# Patient Record
Sex: Female | Born: 1945 | Race: White | Hispanic: No | Marital: Married
Health system: Southern US, Community
[De-identification: ages and names within clinical notes are randomized; demographics above are authoritative.]

## PROBLEM LIST (undated history)

## (undated) DIAGNOSIS — F419 Anxiety disorder, unspecified: Secondary | ICD-10-CM

## (undated) DIAGNOSIS — Z8249 Family history of ischemic heart disease and other diseases of the circulatory system: Secondary | ICD-10-CM

## (undated) DIAGNOSIS — N6009 Solitary cyst of unspecified breast: Secondary | ICD-10-CM

## (undated) DIAGNOSIS — J309 Allergic rhinitis, unspecified: Secondary | ICD-10-CM

## (undated) DIAGNOSIS — R0602 Shortness of breath: Secondary | ICD-10-CM

## (undated) DIAGNOSIS — R200 Anesthesia of skin: Secondary | ICD-10-CM

## (undated) DIAGNOSIS — K269 Duodenal ulcer, unspecified as acute or chronic, without hemorrhage or perforation: Secondary | ICD-10-CM

## (undated) DIAGNOSIS — M7072 Other bursitis of hip, left hip: Secondary | ICD-10-CM

## (undated) DIAGNOSIS — R079 Chest pain, unspecified: Secondary | ICD-10-CM

## (undated) DIAGNOSIS — R5383 Other fatigue: Secondary | ICD-10-CM

## (undated) DIAGNOSIS — R1013 Epigastric pain: Secondary | ICD-10-CM

## (undated) HISTORY — DX: Duodenal ulcer, unspecified as acute or chronic, without hemorrhage or perforation: K26.9

## (undated) HISTORY — DX: Solitary cyst of unspecified breast: N60.09

## (undated) HISTORY — DX: Chest pain, unspecified: R07.9

## (undated) HISTORY — DX: Family history of ischemic heart disease and other diseases of the circulatory system: Z82.49

## (undated) HISTORY — DX: Shortness of breath: R06.02

## (undated) HISTORY — DX: Other bursitis of hip, left hip: M70.72

## (undated) HISTORY — DX: Anxiety disorder, unspecified: F41.9

## (undated) HISTORY — DX: Other fatigue: R53.83

## (undated) HISTORY — DX: Anesthesia of skin: R20.0

## (undated) HISTORY — DX: Allergic rhinitis, unspecified: J30.9

## (undated) HISTORY — DX: Epigastric pain: R10.13

---

## 1997-09-22 ENCOUNTER — Other Ambulatory Visit: Admission: RE | Admit: 1997-09-22 | Discharge: 1997-09-22 | Payer: Self-pay | Admitting: Gynecology

## 1999-01-26 ENCOUNTER — Other Ambulatory Visit: Admission: RE | Admit: 1999-01-26 | Discharge: 1999-01-26 | Payer: Self-pay | Admitting: Gynecology

## 1999-01-26 ENCOUNTER — Encounter: Payer: Self-pay | Admitting: Gynecology

## 1999-01-26 ENCOUNTER — Encounter: Admission: RE | Admit: 1999-01-26 | Discharge: 1999-01-26 | Payer: Self-pay | Admitting: Gynecology

## 2000-06-10 ENCOUNTER — Other Ambulatory Visit: Admission: RE | Admit: 2000-06-10 | Discharge: 2000-06-10 | Payer: Self-pay | Admitting: Gynecology

## 2000-06-10 ENCOUNTER — Encounter: Payer: Self-pay | Admitting: Gynecology

## 2000-06-10 ENCOUNTER — Encounter: Admission: RE | Admit: 2000-06-10 | Discharge: 2000-06-10 | Payer: Self-pay | Admitting: Gynecology

## 2001-09-07 ENCOUNTER — Other Ambulatory Visit: Admission: RE | Admit: 2001-09-07 | Discharge: 2001-09-07 | Payer: Self-pay | Admitting: Gynecology

## 2001-09-07 ENCOUNTER — Encounter: Payer: Self-pay | Admitting: Gynecology

## 2001-09-07 ENCOUNTER — Encounter: Admission: RE | Admit: 2001-09-07 | Discharge: 2001-09-07 | Payer: Self-pay | Admitting: Gynecology

## 2003-02-02 ENCOUNTER — Ambulatory Visit (HOSPITAL_COMMUNITY): Admission: RE | Admit: 2003-02-02 | Discharge: 2003-02-02 | Payer: Self-pay | Admitting: Gastroenterology

## 2003-02-02 ENCOUNTER — Encounter (INDEPENDENT_AMBULATORY_CARE_PROVIDER_SITE_OTHER): Payer: Self-pay | Admitting: *Deleted

## 2003-03-07 ENCOUNTER — Encounter: Admission: RE | Admit: 2003-03-07 | Discharge: 2003-03-07 | Payer: Self-pay | Admitting: Gastroenterology

## 2004-01-26 ENCOUNTER — Ambulatory Visit (HOSPITAL_COMMUNITY): Admission: RE | Admit: 2004-01-26 | Discharge: 2004-01-26 | Payer: Self-pay | Admitting: Gynecology

## 2005-07-08 IMAGING — CT CT PELVIS W/ CM
1 series · 16 of 32 positions shown, 20 images · IV contrast (GASTRO. & OMNIPAQUE [ID])
Comparison: none

CLINICAL DATA: Left upper quadrant pain.
TECHNIQUE: Multidetector helical CT imaging performed through the abdomen and pelvis following dilute oral contrast and 033cc of Omnipaque 300 IV. 
 CT ABDOMEN W/CONTRAST: 
 Small low density area is noted in the tip of the liver most likely a small cyst.  Spleen, pancreas, adrenals, and kidneys are unremarkable.  Bowel and gallbladder grossly unremarkable.  No free fluid, free air, or adenopathy.  Lung bases are clear.

[Series 2: — · axial · 0.70mm/px · z∈[-417,-17]mm · 16 of 118 slices shown, 20 images]
[im 8/118  soft-tissue]
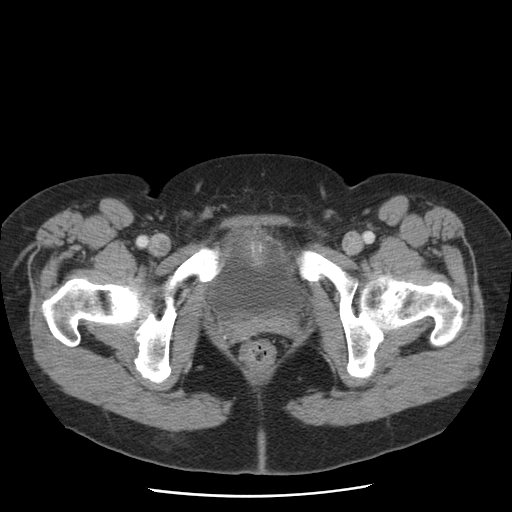
[im 8/118  bone]
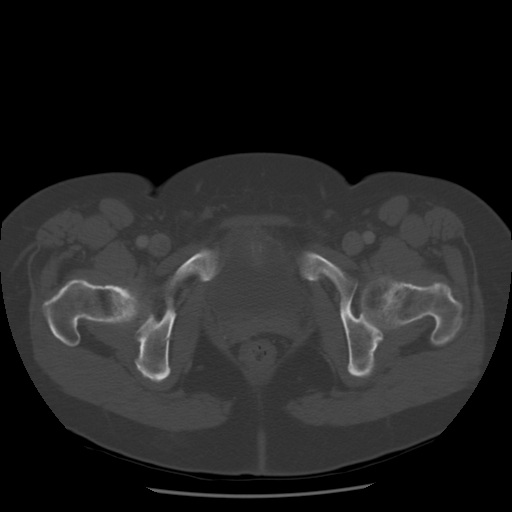
[im 16/118  soft-tissue]
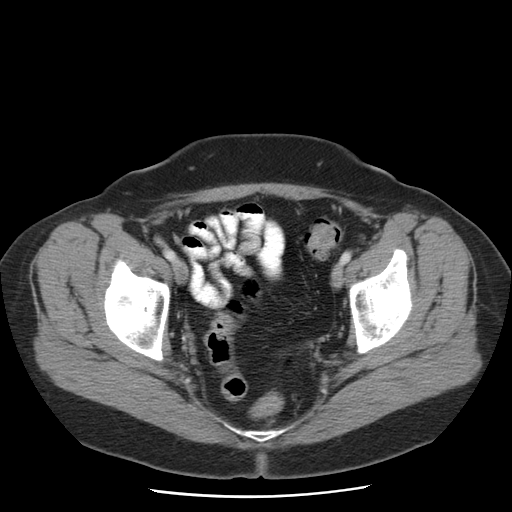
[im 23/118  soft-tissue]
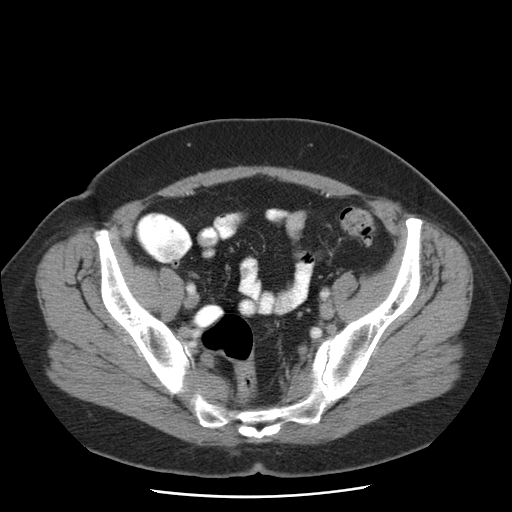
[im 31/118  soft-tissue]
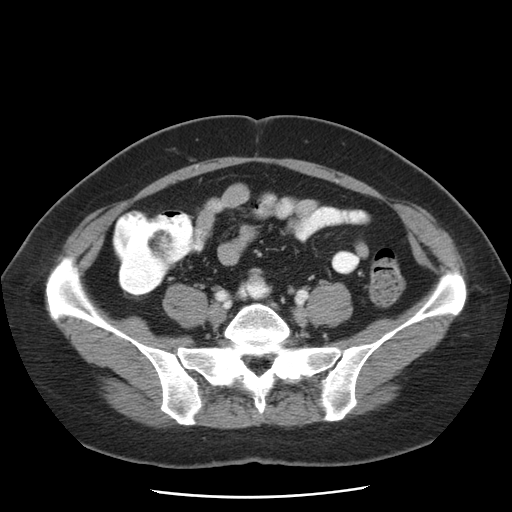
[im 38/118  soft-tissue]
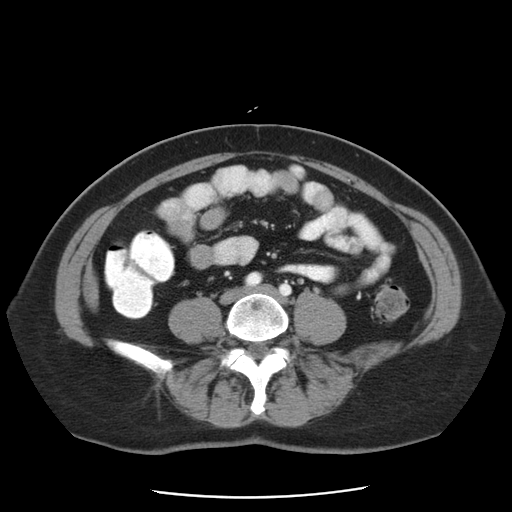
[im 46/118  soft-tissue]
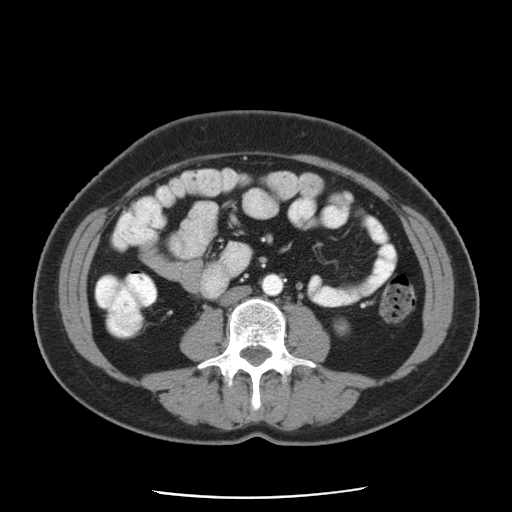
[im 53/118  soft-tissue]
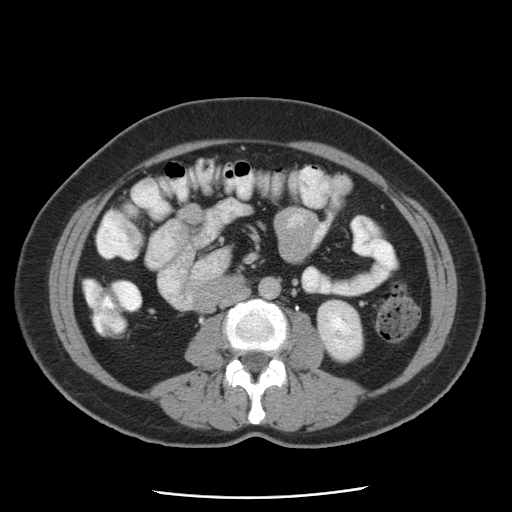
[im 65/118  soft-tissue]
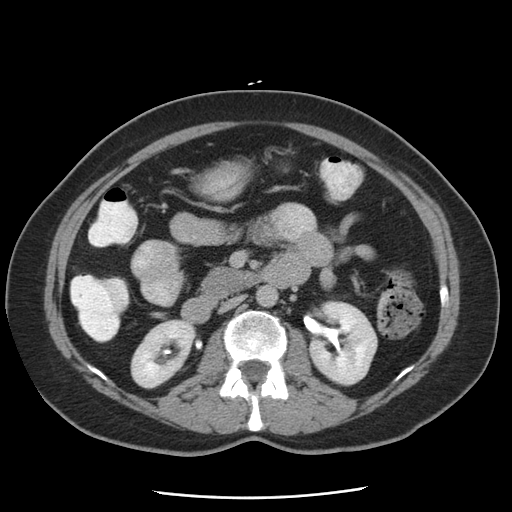
[im 72/118  soft-tissue]
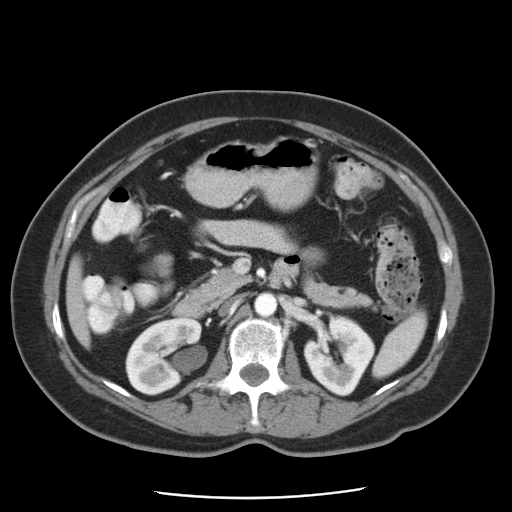
[im 72/118  bone]
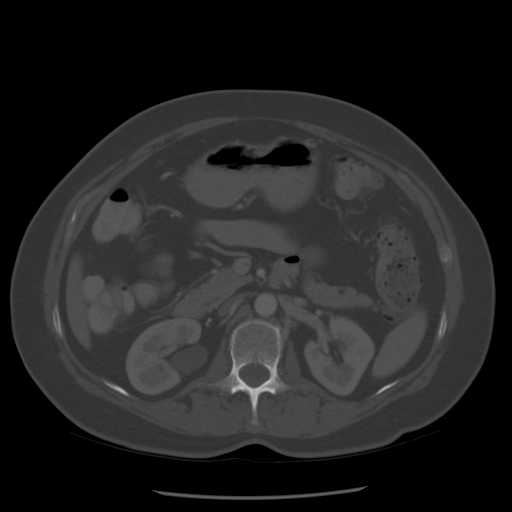
[im 80/118  soft-tissue]
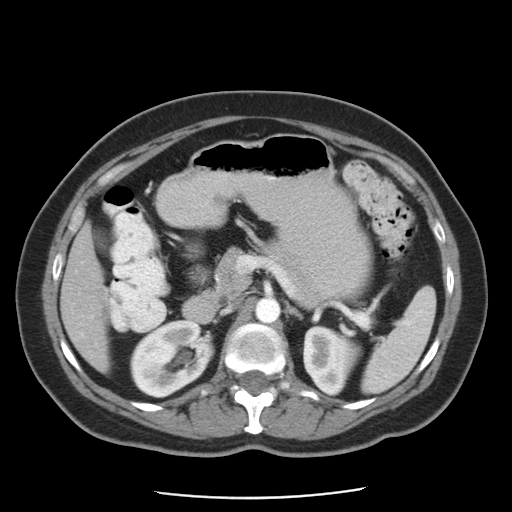
[im 87/118  soft-tissue]
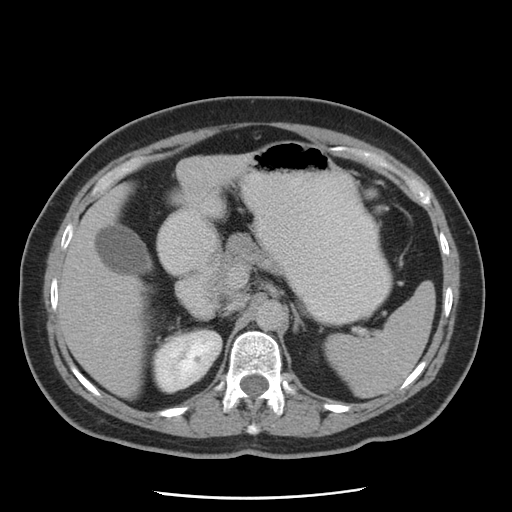
[im 95/118  soft-tissue]
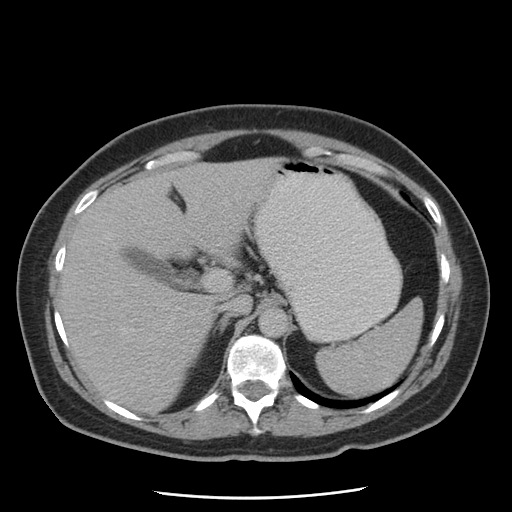
[im 102/118  soft-tissue]
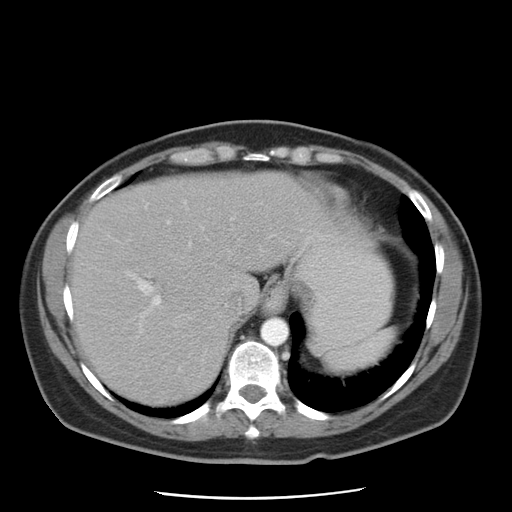
[im 102/118  lung]
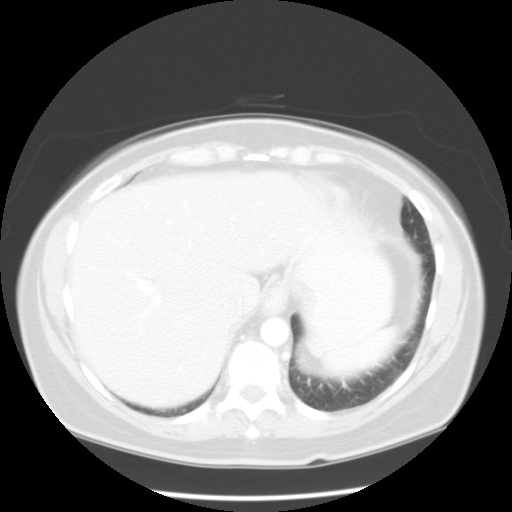
[im 106/118  lung]
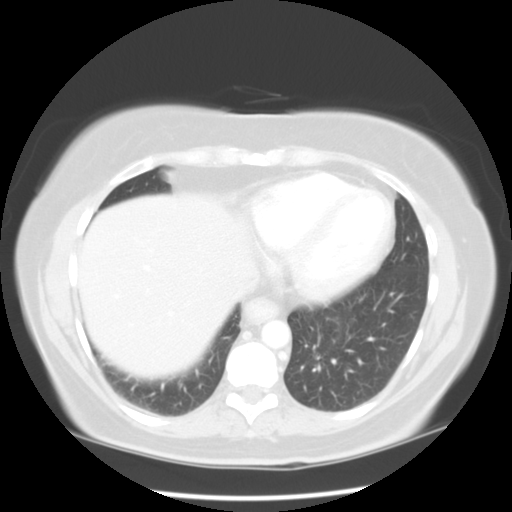
[im 110/118  soft-tissue]
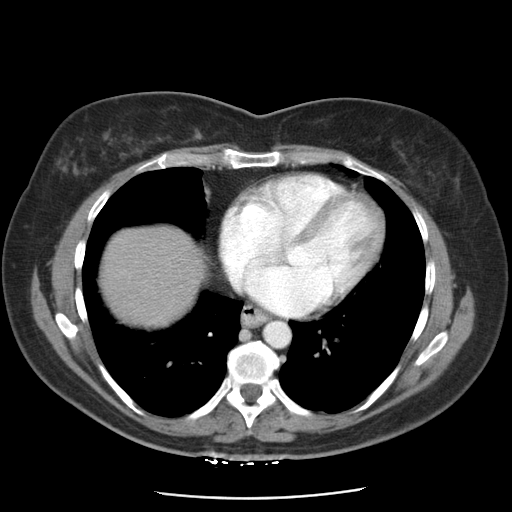
[im 110/118  lung]
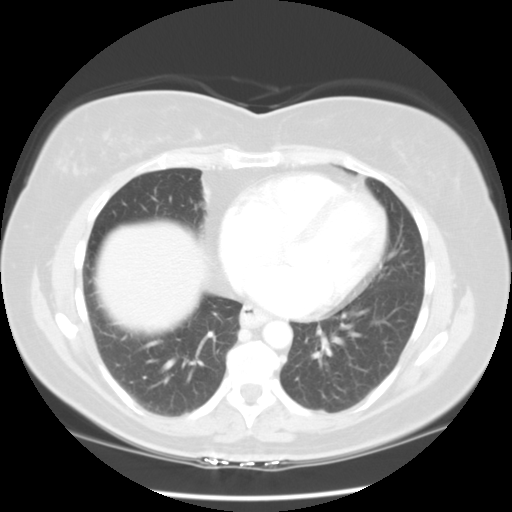
[im 114/118  lung]
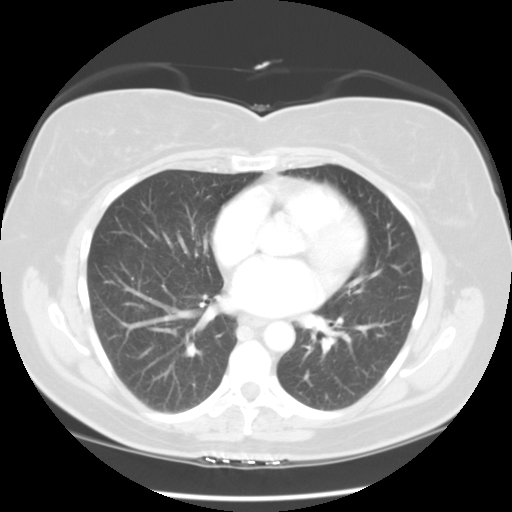

[16 of 32 positions shown; findings below may reference images not displayed]

IMPRESSION: No acute disease in the abdomen. 
 CT PELVIS W/CONTRAST: 
 Scattered diverticula are noted within the distal colon.  No evidence of active diverticulitis.  Appendix is normal.  Bladder is unremarkable.  No free fluid, free air, or adenopathy.
IMPRESSION: Diverticulosis.  No acute disease in the pelvis.

## 2006-07-22 ENCOUNTER — Ambulatory Visit (HOSPITAL_COMMUNITY): Admission: RE | Admit: 2006-07-22 | Discharge: 2006-07-22 | Payer: Self-pay | Admitting: Gynecology

## 2007-01-19 ENCOUNTER — Ambulatory Visit (HOSPITAL_BASED_OUTPATIENT_CLINIC_OR_DEPARTMENT_OTHER): Admission: RE | Admit: 2007-01-19 | Discharge: 2007-01-20 | Payer: Self-pay | Admitting: Specialist

## 2007-10-01 ENCOUNTER — Ambulatory Visit (HOSPITAL_COMMUNITY): Admission: RE | Admit: 2007-10-01 | Discharge: 2007-10-01 | Payer: Self-pay | Admitting: Gynecology

## 2009-01-24 ENCOUNTER — Ambulatory Visit (HOSPITAL_COMMUNITY): Admission: RE | Admit: 2009-01-24 | Discharge: 2009-01-24 | Payer: Self-pay | Admitting: Gynecology

## 2010-02-10 ENCOUNTER — Encounter: Payer: Self-pay | Admitting: Gynecology

## 2010-06-05 NOTE — Op Note (Signed)
Meredith Graham, Meredith Graham                ACCOUNT NO.:  [id number]   MEDICAL RECORD NO.:  [id number]          PATIENT TYPE:  AMB   LOCATION:  [facility]                         FACILITY:  [facility]   PHYSICIAN:  Jene Every, M.D.    DATE OF BIRTH:  04-17-1945   DATE OF PROCEDURE:  01/19/2007  DATE OF DISCHARGE:                               OPERATIVE REPORT   ADDENDUM:  When the three suture anchors were placed in good bone, I  tested them, pulling out against them with fairly good force without  pulling out of the suture anchors.  In addition when we first looked at  the rotator cuff there was significant fraying along the edge of it and  hypertrophic bursa that I excised.      Jene Every, M.D.  Electronically Signed     JB/MEDQ  D:  01/19/2007  T:  01/19/2007  Job:  [id number]

## 2010-06-05 NOTE — Op Note (Signed)
NAMELESIA, MONICA                ACCOUNT NO.:  [id number]   MEDICAL RECORD NO.:  [id number]          PATIENT TYPE:  AMB   LOCATION:  [facility]                         FACILITY:  [facility]   PHYSICIAN:  Jene Every, M.D.    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   PROCEDURES PERFORMED:  1. Open subacromial decompression with repair of rotator cuff,      utilizing 3 Mitek suture anchors.  2. Distal clavicle resection.  3. Exam under anesthesia.   BRIEF HISTORY AND INDICATIONS:  The patient is a 65 year old with  chronic shoulder pain and MRI indicating retracted tear of the rotator  cuff, AC arthrosis and edema of the distal clavicle.  She was indicated  for open rotator cuff repair and distal clavicle resection, with  acromioplasty.  The risks and benefits were discussed including:  bleeding, infection, suboptimal range of motion, re-tear, need for  prolonged immobilization, etc.   TECHNIQUE:  The patient was supine in the beach-chair position.  After  induction of adequate anesthesia and 1 gram of Kefzol, the right  shoulder and upper extremity was prepped and draped in the usual sterile  fashion.  The shoulder was ranged.  She was found to have no significant  adhesive capsulitis.  A surgical marker was utilized to delineate the  acromion, AC joint and the coracoid.  I made a lateral incision over the  [facility] joint and the lateral aspect of the acromion.  Subcutaneous tissue  was dissected.  Electrocautery was utilized to achieve hemostasis.  We  identified the raphe between the anterior and lateral heads of the  deltoid.  We subperiosteally elevated the deltoid, retaining its  attachment to the  acromion.  We then divided and excised the CA  ligament.  We digitally lysed adhesions in the subacromial space.  With  the rotator cuff protected, used an oscillating saw used to remove a  spur off the anterolateral aspect of the acromion.  Torn rotator cuff  was noted; we debrided the bursa.  In a separate fascial incision, I  incised over the distal clavicle joint.  We skeletonized the distal  clavicle, preserving the capsule and protecting the anterior, posterior  and inferior portions of the rotator cuff and deltoid.  We used an  oscillating saw to remove 0.5 cm of the distal clavicle.  We undercut  the inferior aspect of the clavicle with a 3 mm Kerrison, removing  inferior osteophytes.  We preserved all associated ligaments of the  coracoclavicular region and the capsule as well.  It was copiously  irrigated with Gelfoam in the area and bone wax over the distal  clavicle; closed the capsule with #1 Vicryl interrupted figure-of-eight  sutures, and the deltotrapezial fascia with 2-0 Vicryl simple sutures.   Turning back to the rotator cuff, after debridement of subacromial  space  the full retracted tear of the supraspinatus was noted.  This was  retracted halfway, exposing the glenoid and the humeral head.  We  copiously irrigated the glenohumeral joint.  I then advanced the rotator  cuff to the greater tuberosity without difficulty, digitally lysing the  adhesions and mobilizing the rotator cuff.  After this, a bed was made  in the bone lateral to the articular surface medially to the greater  tuberosity with a Matt Holmes rongeur and high-speed bur.  This preserved the  cortical bone.  Next, we saw the tear and the biceps tendon appeared to  be to the lateral aspect of the tear.  I placed 3 Mitek suture anchors;  one 4-pronged and two 2-prongs.  The 4-prong out laterally in a double  row-type fashion in the greater tuberosity, and then the 2-prongs and  cortical bone more medial anterior  and posterior.  I then advanced the  rotator cuff.  I used a stitch to pull the rotator cuff out laterally.  I threaded the threads of the suture anchors through the rotator cuff,  with a good space between them.  With a good surgeon's knot, I adhered  it to and delivered the rotator cuff tendon to the bone.  The first one  was done out laterally, and then the anterior and posterior ones were  threaded and repaired to the bone as well.  Then the area with  supraspinatus and the subscapularis, and the supraspinatus and the  infraspinatus cleavage planes were oversewn with 0 Vicryl interrupted  figure-of-eight sutures.  There was a watertight closure and was full  coverage.  I ranged the shoulder, got significant tension on the wound.   Next, the wound was copiously irrigated, as was the glenohumeral joint.  I Repaired the raphe with #1 Vicryl interrupted figure-of-eight sutures.  I brought the deltoid back to the oversewn end of the acromion and  through with #1 Vicryl with interrupted figure-of-eight sutures for a  watertight closure.  I closed the deltotrapezial fascia and the skin  with 3-0 subcuticular Prolene.  The wound was reinforced with Steri-  Strips.  A sterile dressing applied, placed in abduction pillow,  extubated without difficulty and transported to the recovery room in  satisfactory condition.   The patient tolerated the procedure well with no complications.   ASSISTANT:  Roma Schanz, P.A.   ESTIMATED BLOOD LOSS:  Minimal blood loss.      Jene Every, M.D.  Electronically Signed     JB/MEDQ  D:  01/19/2007  T:  01/20/2007  Job:  [id number]

## 2010-06-08 NOTE — Op Note (Signed)
Meredith Graham, Meredith Graham                          ACCOUNT NO.:  [id number]   MEDICAL RECORD NO.:  [id number]                   PATIENT TYPE:  AMB   LOCATION:  ENDO                                 FACILITY:  [facility]   PHYSICIAN:  Anselmo Rod, M.D.               DATE OF BIRTH:  1945/06/11   DATE OF PROCEDURE:  02/02/2003  DATE OF DISCHARGE:                                 OPERATIVE REPORT   PROCEDURE:  Colonoscopy with snare polypectomy x 2.   ENDOSCOPIST:  Charna Elizabeth, M.D.   INSTRUMENT USED:  Olympus video colonoscope.   INDICATIONS FOR PROCEDURE:  [age]-year-old white female with a family  history of colon cancer in a paternal aunt and a personal history of rectal  bleeding undergoing a screening colonoscopy to rule out colonic polyps,  masses, etc.   PROCEDURE PERFORMED:  Informed consent was procured from the patient.  The  patient fasted for eight hours prior to the procedure and prepped with a  bottle of magnesium citrate and a gallon of GOLYTELY the night prior to the  procedure.   PREPROCEDURE PHYSICAL EXAMINATION:  VITAL SIGNS:  The patient had stable  vital signs.  NECK:  Supple.  CHEST:  Clear to auscultation.  HEART:  S1 and S2 regular.  ABDOMEN:  Soft with normal bowel sounds.   DESCRIPTION OF PROCEDURE:  The patient was placed in the left lateral  decubitus position, sedated with 80 mg of Demerol and 8 mg of Versed  intravenously.  Once the patient was adequately sedated and maintained on  low flow oxygen and continuous cardiac monitoring, the Olympus video  colonoscope was advanced from the rectum to the cecum.  Two sessile polyps  were snared from the distal right colon.  Small internal hemorrhoids were  seen on retroflexion.  There was some residual stool in the colon.  Multiple  washings were done.  The appendiceal orifice and ileocecal valve were  clearly visualized and photographed.  There was no evidence of  diverticulosis.  The patient tolerated the  procedure well without  complications.   IMPRESSION:  1. Small nonbleeding internal hemorrhoids.  2. Two small sessile polyps snared from the distal right colon.  3. No evidence of diverticulosis.   PLAN:  1. Await pathology results.  2. Avoid all nonsteroidals for the next four weeks.  3. Outpatient followup in the next two weeks, earlier if need be.                                               Anselmo Rod, M.D.    JNM/MEDQ  D:  02/02/2003  T:  02/02/2003  Job:  [id number]   cc:   Gretta Cool, M.D.  [address] [place]  [place] [address]  Fax: [phone]   Areatha Keas, M.D.  [address]  [address]  [place]  [place] [address]  Fax: [phone]

## 2011-03-08 ENCOUNTER — Other Ambulatory Visit (HOSPITAL_COMMUNITY): Payer: Self-pay | Admitting: Gynecology

## 2011-03-08 DIAGNOSIS — Z1231 Encounter for screening mammogram for malignant neoplasm of breast: Secondary | ICD-10-CM

## 2011-04-03 ENCOUNTER — Ambulatory Visit (HOSPITAL_COMMUNITY)
Admission: RE | Admit: 2011-04-03 | Discharge: 2011-04-03 | Disposition: A | Discharge status: Home / Self Care | Payer: Medicare Other | Source: Ambulatory Visit | Attending: Gynecology | Admitting: Gynecology

## 2011-04-03 DIAGNOSIS — Z1231 Encounter for screening mammogram for malignant neoplasm of breast: Secondary | ICD-10-CM | POA: Insufficient documentation

## 2011-04-08 ENCOUNTER — Other Ambulatory Visit: Payer: Self-pay | Admitting: Gynecology

## 2011-04-08 DIAGNOSIS — R928 Other abnormal and inconclusive findings on diagnostic imaging of breast: Secondary | ICD-10-CM

## 2011-04-10 ENCOUNTER — Ambulatory Visit
Admission: RE | Admit: 2011-04-10 | Discharge: 2011-04-10 | Disposition: A | Discharge status: Home / Self Care | Payer: PRIVATE HEALTH INSURANCE | Source: Ambulatory Visit | Attending: Gynecology | Admitting: Gynecology

## 2011-04-10 ENCOUNTER — Ambulatory Visit
Admission: RE | Admit: 2011-04-10 | Discharge: 2011-04-10 | Disposition: A | Discharge status: Home / Self Care | Payer: Medicare Other | Source: Ambulatory Visit | Attending: Gynecology | Admitting: Gynecology

## 2011-04-10 DIAGNOSIS — R928 Other abnormal and inconclusive findings on diagnostic imaging of breast: Secondary | ICD-10-CM

## 2012-07-20 ENCOUNTER — Other Ambulatory Visit (HOSPITAL_COMMUNITY): Payer: Self-pay | Admitting: Gynecology

## 2012-07-20 ENCOUNTER — Other Ambulatory Visit (HOSPITAL_COMMUNITY): Payer: Self-pay | Admitting: Family Medicine

## 2012-07-20 DIAGNOSIS — Z1231 Encounter for screening mammogram for malignant neoplasm of breast: Secondary | ICD-10-CM

## 2012-08-04 ENCOUNTER — Ambulatory Visit (HOSPITAL_COMMUNITY): Payer: Medicare Other

## 2012-08-25 ENCOUNTER — Ambulatory Visit (HOSPITAL_COMMUNITY)
Admission: RE | Admit: 2012-08-25 | Discharge: 2012-08-25 | Disposition: A | Discharge status: Home / Self Care | Payer: Medicare Other | Source: Ambulatory Visit | Attending: Family Medicine | Admitting: Family Medicine

## 2012-08-25 DIAGNOSIS — Z1231 Encounter for screening mammogram for malignant neoplasm of breast: Secondary | ICD-10-CM | POA: Insufficient documentation

## 2012-08-26 ENCOUNTER — Other Ambulatory Visit: Payer: Self-pay | Admitting: Family Medicine

## 2012-08-26 DIAGNOSIS — R928 Other abnormal and inconclusive findings on diagnostic imaging of breast: Secondary | ICD-10-CM

## 2012-09-10 ENCOUNTER — Ambulatory Visit
Admission: RE | Admit: 2012-09-10 | Discharge: 2012-09-10 | Disposition: A | Discharge status: Home / Self Care | Payer: Medicare Other | Source: Ambulatory Visit | Attending: Family Medicine | Admitting: Family Medicine

## 2012-09-10 DIAGNOSIS — R928 Other abnormal and inconclusive findings on diagnostic imaging of breast: Secondary | ICD-10-CM

## 2013-02-16 ENCOUNTER — Telehealth: Payer: Self-pay

## 2013-02-16 NOTE — Telephone Encounter (Signed)
Relevant patient education mailed to patient.  

## 2013-03-25 ENCOUNTER — Other Ambulatory Visit: Payer: Self-pay | Admitting: Gynecology

## 2013-03-25 DIAGNOSIS — N6009 Solitary cyst of unspecified breast: Secondary | ICD-10-CM

## 2013-04-07 ENCOUNTER — Ambulatory Visit
Admission: RE | Admit: 2013-04-07 | Discharge: 2013-04-07 | Disposition: A | Discharge status: Home / Self Care | Payer: Medicare Other | Source: Ambulatory Visit | Attending: Gynecology | Admitting: Gynecology

## 2013-04-07 DIAGNOSIS — N6009 Solitary cyst of unspecified breast: Secondary | ICD-10-CM

## 2013-04-08 ENCOUNTER — Ambulatory Visit
Admission: RE | Admit: 2013-04-08 | Discharge: 2013-04-08 | Disposition: A | Discharge status: Home / Self Care | Payer: Medicare Other | Source: Ambulatory Visit | Attending: Family Medicine | Admitting: Family Medicine

## 2013-04-08 ENCOUNTER — Other Ambulatory Visit: Payer: Self-pay | Admitting: Family Medicine

## 2013-04-08 DIAGNOSIS — R079 Chest pain, unspecified: Secondary | ICD-10-CM

## 2013-04-26 ENCOUNTER — Encounter: Payer: Self-pay | Admitting: *Deleted

## 2013-04-28 ENCOUNTER — Ambulatory Visit (INDEPENDENT_AMBULATORY_CARE_PROVIDER_SITE_OTHER): Payer: Medicare Other

## 2013-04-28 ENCOUNTER — Encounter: Payer: Self-pay | Admitting: Cardiology

## 2013-04-28 DIAGNOSIS — R079 Chest pain, unspecified: Secondary | ICD-10-CM

## 2013-04-28 NOTE — Progress Notes (Signed)
Exercise Treadmill Test  Pre-Exercise Testing Evaluation Rhythm: normal sinus  Rate: 78 bpm     Test  Exercise Tolerance Test Ordering MD: Marca Anconaalton McLean, MD  Interpreting MD: Ronie Spiesayna Dunn, PA  Unique Test No: 1  Treadmill:  1  Indication for ETT: chest pain - rule out ischemia  Contraindication to ETT: No   Stress Modality: exercise - treadmill  Cardiac Imaging Performed: non   Protocol: standard Bruce - maximal  Max BP:  187/71  Max MPHR (bpm):  152 85% MPR (bpm):  129  MPHR obtained (bpm):  155 % MPHR obtained:  101%  Reached 85% MPHR (min:sec):  3:24 Total Exercise Time (min-sec):  7:00  Workload in METS:  8.5 Borg Scale: 17  Reason ETT Terminated:  dyspnea    ST Segment Analysis At Rest: normal ST segments - no evidence of significant ST depression With Exercise: non-specific ST changes  Other Information Arrhythmia:  No Angina during ETT:  Absent Quality of ETT:  diagnostic  ETT Interpretation:  normal - no evidence of ischemia by ST analysis  Comments: Good exercise tolerance - walked 7 minutes on Bruce protocol. No exertional chest pain. Mild SOB at peak exercise. Nonspecific upsloping ST depression at peak exercise II, v5-V6 (<0.805mm) and not consistent in contiguous beats so likely partially due to walking artifact. Reviewed with Dr. Shirlee LatchMcLean - these are not indicative of ischemic changes. Normal stress test study.  Recommendations: F/u PCP as planned.

## 2013-08-02 ENCOUNTER — Other Ambulatory Visit: Payer: Self-pay | Admitting: Family Medicine

## 2013-08-02 DIAGNOSIS — N6001 Solitary cyst of right breast: Secondary | ICD-10-CM

## 2013-08-31 ENCOUNTER — Other Ambulatory Visit: Payer: Medicare Other

## 2013-09-02 ENCOUNTER — Encounter (INDEPENDENT_AMBULATORY_CARE_PROVIDER_SITE_OTHER): Payer: Self-pay

## 2013-09-02 ENCOUNTER — Ambulatory Visit
Admission: RE | Admit: 2013-09-02 | Discharge: 2013-09-02 | Disposition: A | Discharge status: Home / Self Care | Payer: Medicare Other | Source: Ambulatory Visit | Attending: Family Medicine | Admitting: Family Medicine

## 2013-09-02 DIAGNOSIS — N6001 Solitary cyst of right breast: Secondary | ICD-10-CM

## 2013-09-14 ENCOUNTER — Ambulatory Visit: Payer: Medicare Other | Admitting: Internal Medicine

## 2014-10-24 ENCOUNTER — Other Ambulatory Visit: Payer: Self-pay | Admitting: Internal Medicine

## 2014-10-24 DIAGNOSIS — N6001 Solitary cyst of right breast: Secondary | ICD-10-CM

## 2014-10-31 ENCOUNTER — Other Ambulatory Visit: Payer: Medicare Other

## 2014-11-01 ENCOUNTER — Ambulatory Visit
Admission: RE | Admit: 2014-11-01 | Discharge: 2014-11-01 | Disposition: A | Discharge status: Home / Self Care | Payer: Medicare Other | Source: Ambulatory Visit | Attending: Internal Medicine | Admitting: Internal Medicine

## 2014-11-01 DIAGNOSIS — N6001 Solitary cyst of right breast: Secondary | ICD-10-CM

## 2015-08-10 IMAGING — CR DG CHEST 2V
2 series · 2 of 2 positions shown · non-contrast
Comparison: None.

CLINICAL DATA: Chest pain for several months, no trauma

EXAM:
CHEST  2 VIEW

[view not recorded (1 of 2)]
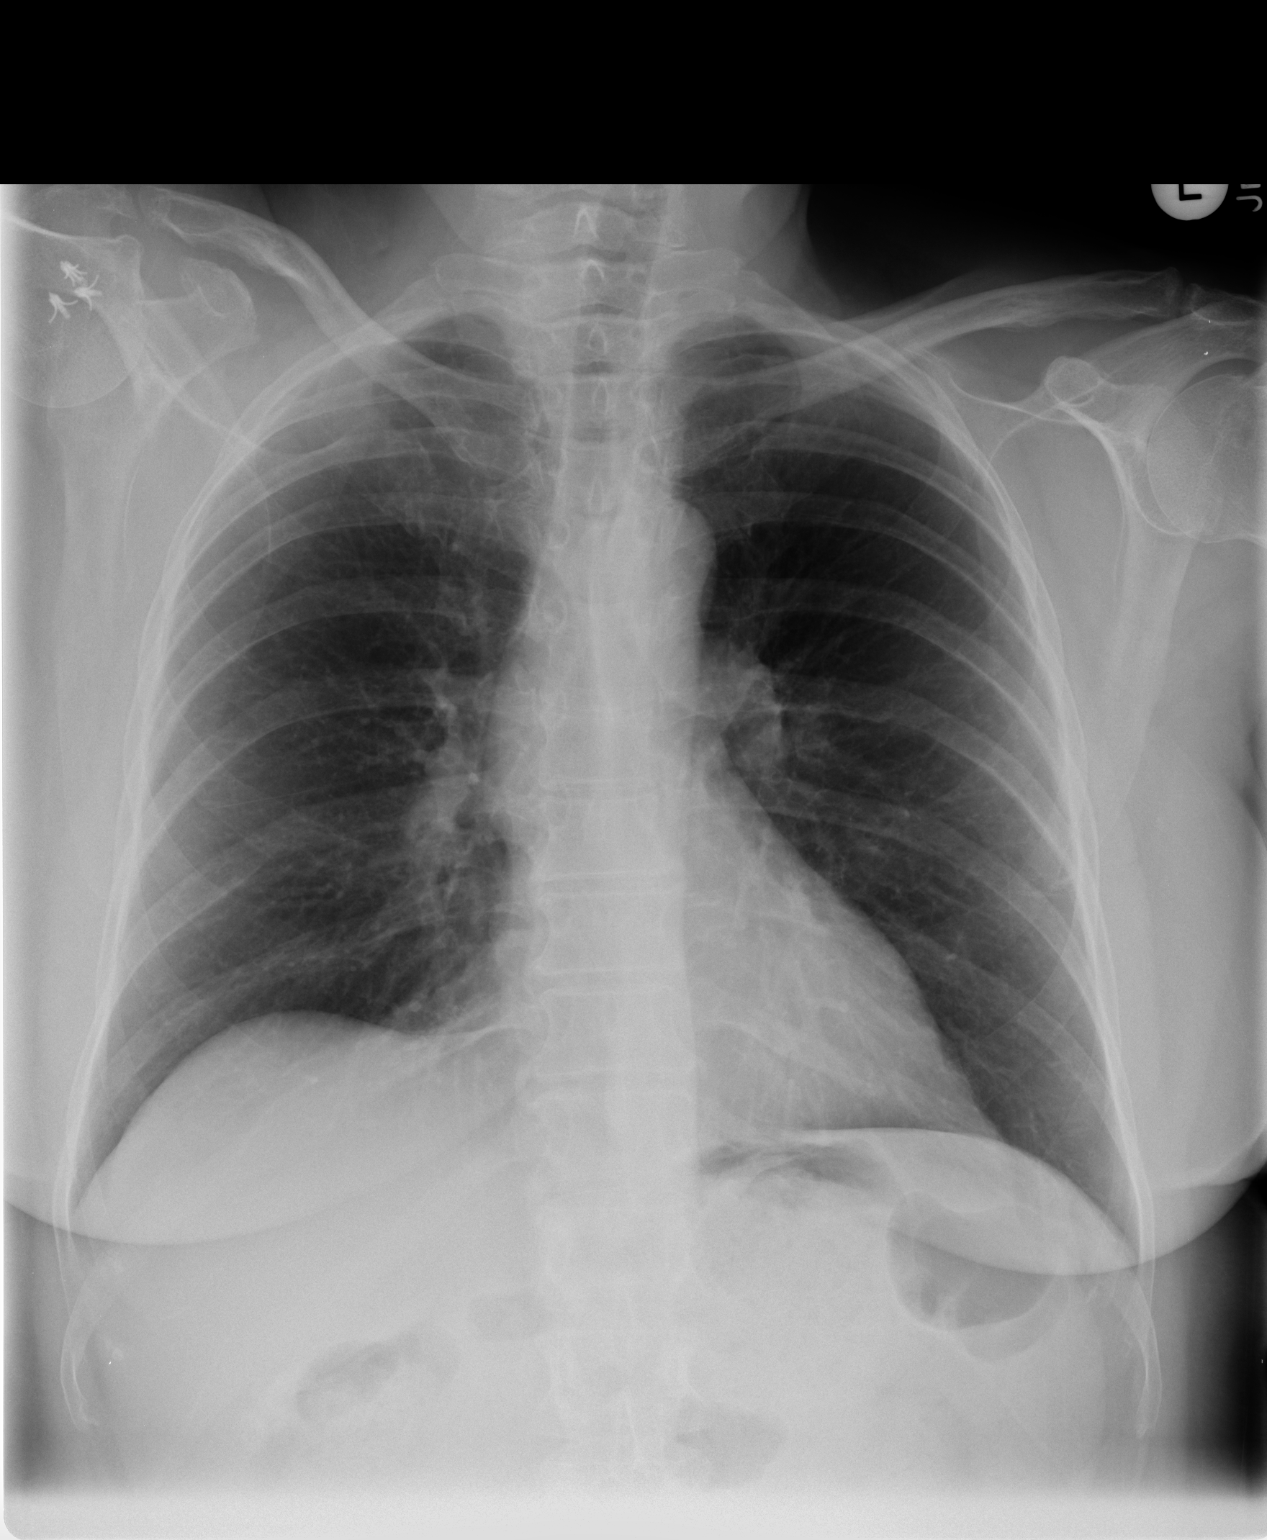

[view not recorded (2 of 2)]
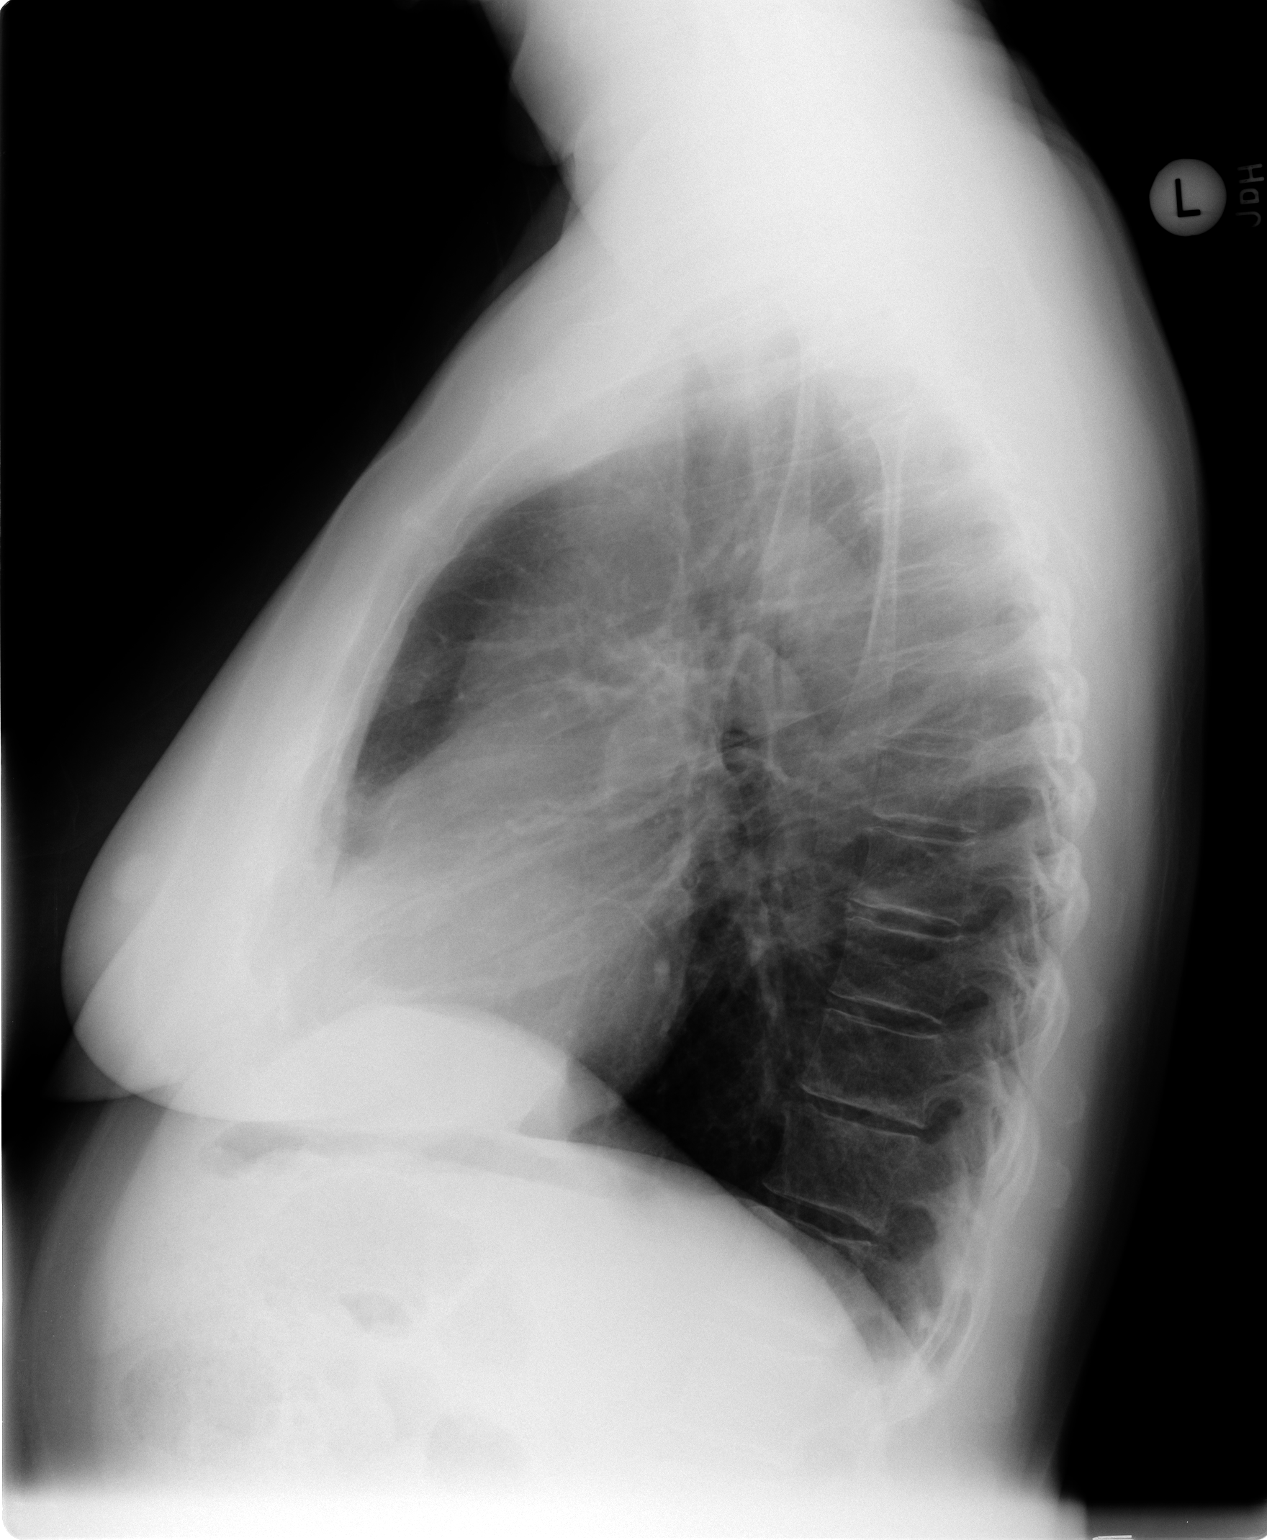

[2 of 2 positions shown; findings below may reference images not displayed]

FINDINGS: No active infiltrate or effusion is seen. Mediastinal contours
appear normal. The heart is within normal limits in size. No bony
abnormality is seen.
IMPRESSION: No active cardiopulmonary disease.

## 2015-11-22 ENCOUNTER — Other Ambulatory Visit: Payer: Self-pay | Admitting: Internal Medicine

## 2015-11-22 DIAGNOSIS — Z1231 Encounter for screening mammogram for malignant neoplasm of breast: Secondary | ICD-10-CM

## 2015-12-25 ENCOUNTER — Other Ambulatory Visit: Payer: Self-pay | Admitting: Internal Medicine

## 2015-12-25 DIAGNOSIS — Z1231 Encounter for screening mammogram for malignant neoplasm of breast: Secondary | ICD-10-CM

## 2015-12-26 ENCOUNTER — Ambulatory Visit: Payer: Medicare Other

## 2016-01-01 ENCOUNTER — Ambulatory Visit
Admission: RE | Admit: 2016-01-01 | Discharge: 2016-01-01 | Disposition: A | Discharge status: Home / Self Care | Payer: Medicare Other | Source: Ambulatory Visit | Attending: Internal Medicine | Admitting: Internal Medicine

## 2016-01-01 DIAGNOSIS — Z1231 Encounter for screening mammogram for malignant neoplasm of breast: Secondary | ICD-10-CM

## 2017-02-04 ENCOUNTER — Other Ambulatory Visit: Payer: Self-pay | Admitting: Internal Medicine

## 2017-02-04 DIAGNOSIS — Z1231 Encounter for screening mammogram for malignant neoplasm of breast: Secondary | ICD-10-CM

## 2017-02-26 ENCOUNTER — Ambulatory Visit
Admission: RE | Admit: 2017-02-26 | Discharge: 2017-02-26 | Disposition: A | Discharge status: Home / Self Care | Payer: Medicare Other | Source: Ambulatory Visit | Attending: Internal Medicine | Admitting: Internal Medicine

## 2017-02-26 DIAGNOSIS — Z1231 Encounter for screening mammogram for malignant neoplasm of breast: Secondary | ICD-10-CM
